# Patient Record
Sex: Female | Born: 1980 | Race: Black or African American | Hispanic: No | Marital: Married
Health system: Southern US, Community
[De-identification: ages and names within clinical notes are randomized; demographics above are authoritative.]

---

## 2004-05-04 ENCOUNTER — Emergency Department (HOSPITAL_COMMUNITY): Admission: EM | Admit: 2004-05-04 | Discharge: 2004-05-04 | Payer: Self-pay | Admitting: Emergency Medicine

## 2020-12-29 ENCOUNTER — Emergency Department (HOSPITAL_COMMUNITY): Payer: Managed Care, Other (non HMO)

## 2020-12-29 ENCOUNTER — Emergency Department (HOSPITAL_COMMUNITY)
Admission: EM | Admit: 2020-12-29 | Discharge: 2020-12-29 | Disposition: A | Discharge status: Home / Self Care | Payer: Managed Care, Other (non HMO) | Attending: Emergency Medicine | Admitting: Emergency Medicine

## 2020-12-29 ENCOUNTER — Other Ambulatory Visit: Payer: Self-pay

## 2020-12-29 DIAGNOSIS — R1030 Lower abdominal pain, unspecified: Secondary | ICD-10-CM | POA: Insufficient documentation

## 2020-12-29 DIAGNOSIS — S40211A Abrasion of right shoulder, initial encounter: Secondary | ICD-10-CM | POA: Diagnosis not present

## 2020-12-29 DIAGNOSIS — Z23 Encounter for immunization: Secondary | ICD-10-CM | POA: Diagnosis not present

## 2020-12-29 DIAGNOSIS — Z91040 Latex allergy status: Secondary | ICD-10-CM | POA: Diagnosis not present

## 2020-12-29 DIAGNOSIS — Y92410 Unspecified street and highway as the place of occurrence of the external cause: Secondary | ICD-10-CM | POA: Insufficient documentation

## 2020-12-29 DIAGNOSIS — S4991XA Unspecified injury of right shoulder and upper arm, initial encounter: Secondary | ICD-10-CM | POA: Diagnosis present

## 2020-12-29 DIAGNOSIS — M25561 Pain in right knee: Secondary | ICD-10-CM | POA: Diagnosis not present

## 2020-12-29 MED ORDER — TETANUS-DIPHTH-ACELL PERTUSSIS 5-2.5-18.5 LF-MCG/0.5 IM SUSY
0.5000 mL | PREFILLED_SYRINGE | Freq: Once | INTRAMUSCULAR | Status: AC
  Administered 2020-12-29: 0.5 mL via INTRAMUSCULAR
  Filled 2020-12-29: qty 0.5

## 2020-12-29 MED ORDER — IBUPROFEN 600 MG PO TABS: 600.0000 mg | ORAL_TABLET | Freq: Four times a day (QID) | ORAL | 0 refills | Status: AC | PRN

## 2020-12-29 MED ORDER — IBUPROFEN 800 MG PO TABS
800.0000 mg | ORAL_TABLET | Freq: Once | ORAL | Status: AC
  Administered 2020-12-29: 800 mg via ORAL
  Filled 2020-12-29: qty 1

## 2020-12-29 MED ORDER — CYCLOBENZAPRINE HCL 10 MG PO TABS: 10.0000 mg | ORAL_TABLET | Freq: Two times a day (BID) | ORAL | 0 refills | Status: AC | PRN
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
## 2023-08-30 ENCOUNTER — Other Ambulatory Visit (HOSPITAL_COMMUNITY): Payer: Self-pay

## 2023-08-30 ENCOUNTER — Encounter (HOSPITAL_COMMUNITY): Payer: Self-pay | Admitting: Emergency Medicine

## 2023-08-30 ENCOUNTER — Emergency Department (HOSPITAL_COMMUNITY)
Admission: EM | Admit: 2023-08-30 | Discharge: 2023-08-30 | Disposition: A | Discharge status: Home / Self Care | Payer: BC Managed Care – PPO | Attending: Emergency Medicine | Admitting: Emergency Medicine

## 2023-08-30 DIAGNOSIS — Z91040 Latex allergy status: Secondary | ICD-10-CM | POA: Insufficient documentation

## 2023-08-30 DIAGNOSIS — R509 Fever, unspecified: Secondary | ICD-10-CM | POA: Diagnosis present

## 2023-08-30 DIAGNOSIS — U071 COVID-19: Secondary | ICD-10-CM | POA: Diagnosis not present

## 2023-08-30 DIAGNOSIS — R112 Nausea with vomiting, unspecified: Secondary | ICD-10-CM

## 2023-08-30 LAB — RESP PANEL BY RT-PCR (RSV, FLU A&B, COVID)  RVPGX2
Influenza A by PCR: NEGATIVE
Influenza B by PCR: NEGATIVE
Resp Syncytial Virus by PCR: NEGATIVE
SARS Coronavirus 2 by RT PCR: POSITIVE — AB

## 2023-08-30 MED ORDER — LIDOCAINE VISCOUS HCL 2 % MT SOLN
15.0000 mL | Freq: Once | OROMUCOSAL | Status: AC
  Administered 2023-08-30: 15 mL via OROMUCOSAL

## 2023-08-30 MED ORDER — ONDANSETRON HCL 4 MG PO TABS: 4.0000 mg | ORAL_TABLET | Freq: Three times a day (TID) | ORAL | 0 refills | Status: AC | PRN

## 2023-08-30 MED ORDER — ACETAMINOPHEN 325 MG PO TABS
650.0000 mg | ORAL_TABLET | Freq: Once | ORAL | Status: AC
  Administered 2023-08-30: 650 mg via ORAL

## 2023-08-30 MED ORDER — ONDANSETRON 4 MG PO TBDP
4.0000 mg | ORAL_TABLET | Freq: Once | ORAL | Status: AC
  Administered 2023-08-30: 4 mg via ORAL

## 2023-08-30 MED ORDER — BENZONATATE 100 MG PO CAPS: 100.0000 mg | ORAL_CAPSULE | Freq: Three times a day (TID) | ORAL | 0 refills | Status: AC

## 2023-08-30 NOTE — ED Triage Notes (Signed)
 Pt here from home with c/o n/v , positive for covid yesterday and is requesting fluids

## 2023-08-30 NOTE — ED Provider Triage Note (Signed)
 Emergency Medicine Provider Triage Evaluation Note  Rachel Byrd , a 43 y.o. female  was evaluated in triage.  Pt complains of being Dx w/ COVID 2 days ago and has vomited 8x/day since. Now experiencing shortness of breath on exertion. Endorses sick contacts at work.   Endorses headache, nausea, blurry vision when standing Denies chest pain, abdominal pain, diarrhea, LE swelling, LE pain  Review of Systems  Positive: N/a Negative: N/a  Physical Exam  BP (!) 144/96 (BP Location: Left Arm)   Pulse 100   Temp 99.7 F (37.6 C) (Oral)   Resp 20   SpO2 100%  Gen:   Awake, no distress   Resp:  Normal effort  MSK:   Moves extremities without difficulty  Other:    Medical Decision Making  Medically screening exam initiated at 12:05 PM.  Appropriate orders placed.  Rachel Byrd was informed that the remainder of the evaluation will be completed by another provider, this initial triage assessment does not replace that evaluation, and the importance of remaining in the ED until their evaluation is complete.     Beola Terrall RAMAN, [place] 08/30/23 1209
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
## 2023-08-30 NOTE — Discharge Instructions (Signed)
 Please take Zofran  as needed for your nausea.  Get plenty of rest and drink plenty of fluid as you are recovering from your COVID infection.  You may take Tessalon  as needed for cough.  Return if you have any concern.

## 2023-08-31 ENCOUNTER — Other Ambulatory Visit (HOSPITAL_COMMUNITY): Payer: Self-pay
